# Patient Record
Sex: Male | Born: 1975 | Race: White | Hispanic: No | Marital: Single | Smoking: Never smoker
Health system: Southern US, Community
[De-identification: ages and names within clinical notes are randomized; demographics above are authoritative.]

---

## 2013-10-31 ENCOUNTER — Ambulatory Visit: Payer: Self-pay

## 2020-02-25 ENCOUNTER — Ambulatory Visit (INDEPENDENT_AMBULATORY_CARE_PROVIDER_SITE_OTHER): Payer: Self-pay

## 2020-02-25 ENCOUNTER — Encounter (HOSPITAL_COMMUNITY): Payer: Self-pay | Admitting: *Deleted

## 2020-02-25 ENCOUNTER — Other Ambulatory Visit: Payer: Self-pay

## 2020-02-25 ENCOUNTER — Ambulatory Visit (HOSPITAL_COMMUNITY)
Admission: EM | Admit: 2020-02-25 | Discharge: 2020-02-25 | Disposition: A | Discharge status: Home / Self Care | Payer: Self-pay | Attending: Internal Medicine | Admitting: Internal Medicine

## 2020-02-25 DIAGNOSIS — W2201XA Walked into wall, initial encounter: Secondary | ICD-10-CM

## 2020-02-25 DIAGNOSIS — M25541 Pain in joints of right hand: Secondary | ICD-10-CM

## 2020-02-25 DIAGNOSIS — M79641 Pain in right hand: Secondary | ICD-10-CM

## 2020-02-25 MED ORDER — IBUPROFEN 600 MG PO TABS: 600.0000 mg | ORAL_TABLET | Freq: Four times a day (QID) | ORAL | 0 refills | Status: DC | PRN

## 2020-02-25 NOTE — Discharge Instructions (Signed)
Take ibuprofen as needed for discomfort.  Rest and elevate your hand.  Apply ice packs as directed.    Schedule an appointment with the hand specialist listed below if your symptoms are not improving.

## 2020-02-25 NOTE — ED Triage Notes (Signed)
Pt reports he punched a wall with RT hand and thinks the hand is broken.

## 2020-02-25 NOTE — ED Provider Notes (Signed)
[facility]    CSN: [id number] Arrival date & time: 02/25/20  0843      History   Chief Complaint Chief Complaint  Patient presents with  . Hand Injury    RT    HPI Steve Hicks is a 45 y.o. male.   Patient presents with pain and swelling of his right hand after punching a wall last night.  He denies numbness.  No open wounds.  He denies fever, chills, chest pain, shortness of breath, abdominal pain, or other symptoms.  He states he previously broke his right hand 20 years ago.  The history is provided by the patient and medical records.    History reviewed. No pertinent past medical history.  There are no problems to display for this patient.   History reviewed. No pertinent surgical history.     Home Medications    Prior to Admission medications   Not on File    Family History History reviewed. No pertinent family history.  Social History Social History   Tobacco Use  . Smoking status: Never Smoker  . Smokeless tobacco: Never Used     Allergies   Patient has no known allergies.   Review of Systems Review of Systems  Constitutional: Negative for chills and fever.  HENT: Negative for ear pain and sore throat.   Eyes: Negative for pain and visual disturbance.  Respiratory: Negative for cough and shortness of breath.   Cardiovascular: Negative for chest pain and palpitations.  Gastrointestinal: Negative for abdominal pain and vomiting.  Genitourinary: Negative for dysuria and hematuria.  Musculoskeletal: Positive for arthralgias. Negative for back pain.  Skin: Negative for color change and rash.  Neurological: Negative for seizures, syncope, weakness and numbness.  All other systems reviewed and are negative.    Physical Exam Triage Vital Signs ED Triage Vitals  Enc Vitals Group     BP 02/25/20 0920 122/82     Pulse Rate 02/25/20 0920 90     Resp 02/25/20 0920 16     Temp 02/25/20 0920 98.3 F (36.8 C)     Temp Source  02/25/20 0920 Oral     SpO2 02/25/20 0920 99 %     Weight --      Height --      Head Circumference --      Peak Flow --      Pain Score 02/25/20 0924 5     Pain Loc --      Pain Edu? --      Excl. in GC? --    No data found.  Updated Vital Signs BP 122/82 (BP Location: Left Arm)   Pulse 90   Temp 98.3 F (36.8 C) (Oral)   Resp 16   SpO2 99%   Visual Acuity Right Eye Distance:   Left Eye Distance:   Bilateral Distance:    Right Eye Near:   Left Eye Near:    Bilateral Near:     Physical Exam Vitals and nursing note reviewed.  Constitutional:      General: He is not in acute distress.    Appearance: He is well-developed and well-nourished.  HENT:     Head: Normocephalic and atraumatic.     Mouth/Throat:     Mouth: Mucous membranes are moist.  Eyes:     Conjunctiva/sclera: Conjunctivae normal.  Cardiovascular:     Rate and Rhythm: Normal rate and regular rhythm.     Heart sounds: Normal heart sounds.  Pulmonary:  Effort: Pulmonary effort is normal. No respiratory distress.     Breath sounds: Normal breath sounds.  Abdominal:     Palpations: Abdomen is soft.     Tenderness: There is no abdominal tenderness.  Musculoskeletal:        General: Swelling, tenderness and deformity present. No edema.       Arms:     Cervical back: Neck supple.     Comments: Right hand: limited ROM due to pain. Sensation intact. No open wounds. No erythema or ecchymosis.    Skin:    General: Skin is warm and dry.     Capillary Refill: Capillary refill takes less than 2 seconds.     Findings: No bruising, erythema, lesion or rash.  Neurological:     General: No focal deficit present.     Mental Status: He is alert and oriented to person, place, and time.     Sensory: No sensory deficit.     Motor: No weakness.     Gait: Gait normal.  Psychiatric:        Mood and Affect: Mood and affect and mood normal.        Behavior: Behavior normal.      UC Treatments / Results   Labs (all labs ordered are listed, but only abnormal results are displayed) Labs Reviewed - No data to display  EKG   Radiology DG Hand Complete Right  Result Date: 02/25/2020 CLINICAL DATA:  Pain after punching wall, history of fracture EXAM: RIGHT HAND - COMPLETE 3+ VIEW COMPARISON:  None. FINDINGS: No dislocation. No acute fracture identified. Probable chronic fracture at the base of the fifth metacarpal. There is no evidence of arthropathy or other focal bone abnormality. IMPRESSION: No acute fracture or dislocation. Electronically Signed   By: Guadlupe Spanish M.D.   On: 02/25/2020 10:04    Procedures Procedures (including critical care time)  Medications Ordered in UC Medications - No data to display  Initial Impression / Assessment and Plan / UC Course  I have reviewed the triage vital signs and the nursing notes.  Pertinent labs & imaging results that were available during my care of the patient were reviewed by me and considered in my medical decision making (see chart for details).   Right hand pain.  X-ray shows no acute fracture.  Patient declines prescription ibuprofen.  Discussed symptomatic treatment with rest, elevation, ice packs, OTC ibuprofen.  Instructed patient to follow-up with hand specialist Dr. Izora Ribas (oncall) if his symptoms are not improving.  He agrees to plan of care.   Final Clinical Impressions(s) / UC Diagnoses   Final diagnoses:  Right hand pain     Discharge Instructions     Take ibuprofen as needed for discomfort.  Rest and elevate your hand.  Apply ice packs as directed.    Schedule an appointment with the hand specialist listed below if your symptoms are not improving.        ED Prescriptions    Medication Sig Dispense Auth. Provider   ibuprofen (ADVIL) 600 MG tablet  (Status: Discontinued) Take 1 tablet (600 mg total) by mouth every 6 (six) hours as needed. 30 tablet Mickie Bail, NP     PDMP not reviewed this encounter.   Mickie Bail, NP 02/25/20 1022

## 2022-07-20 IMAGING — DX DG HAND COMPLETE 3+V*R*
3 series · 3 of 3 positions shown · non-contrast
Comparison: None.

CLINICAL DATA: Pain after punching wall, history of fracture

EXAM:
RIGHT HAND - COMPLETE 3+ VIEW

[hand pa]
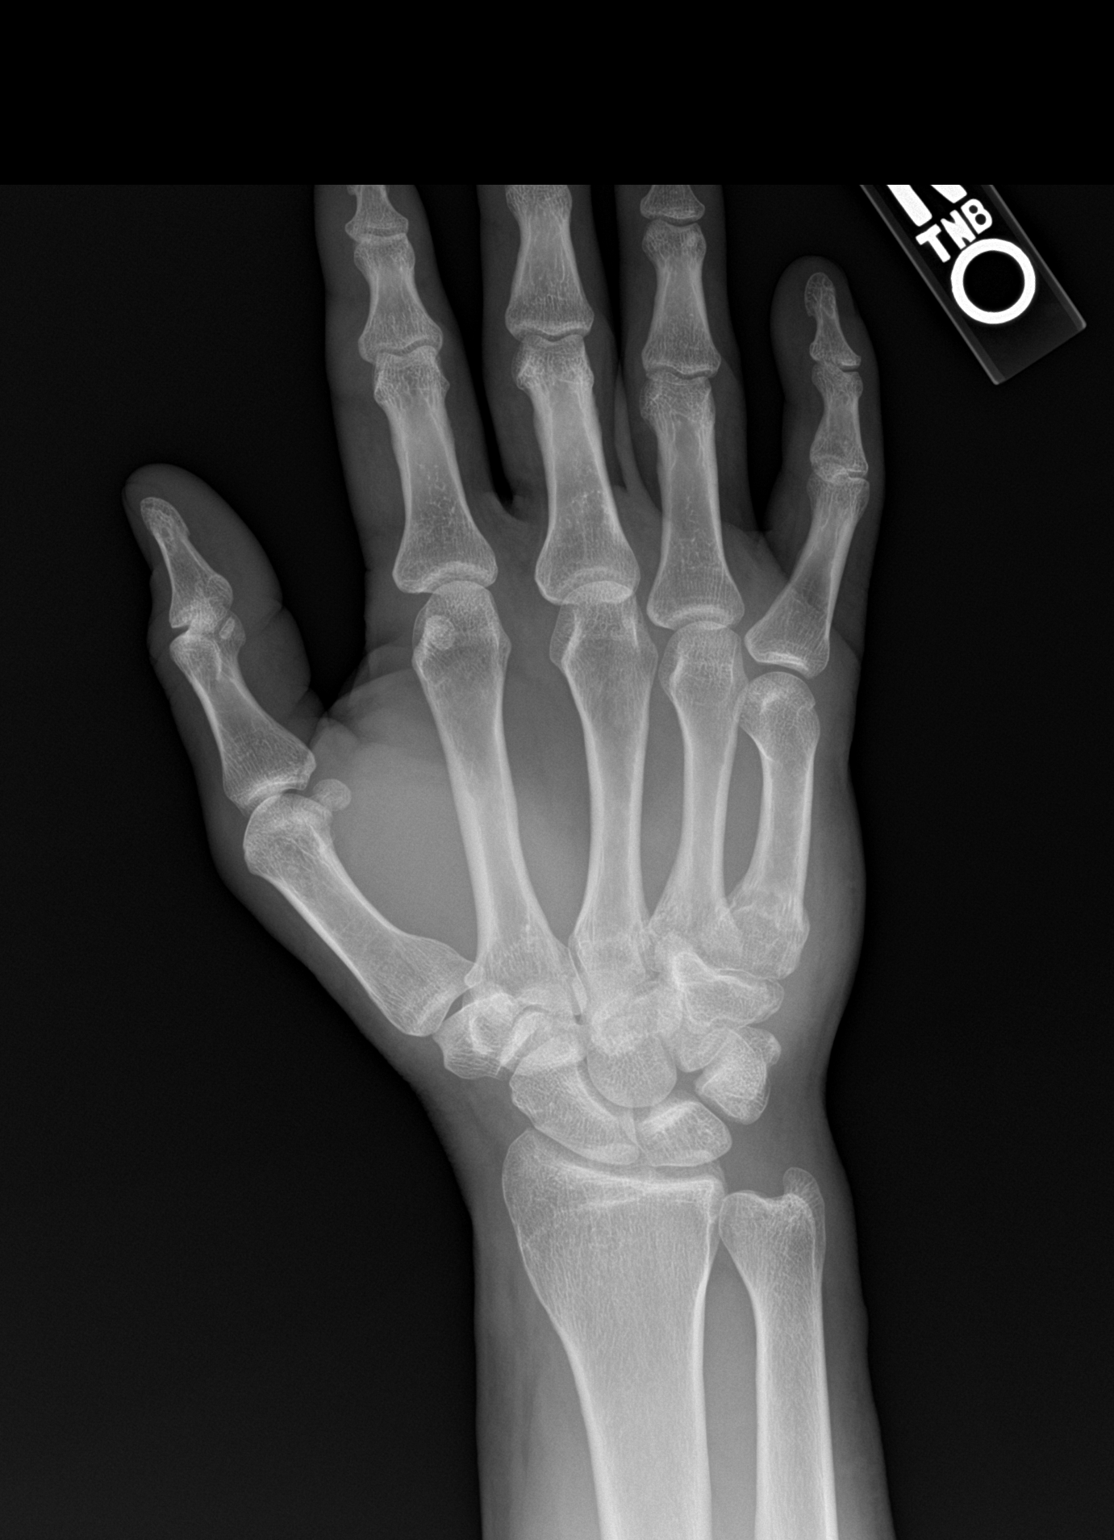

[hand obl]
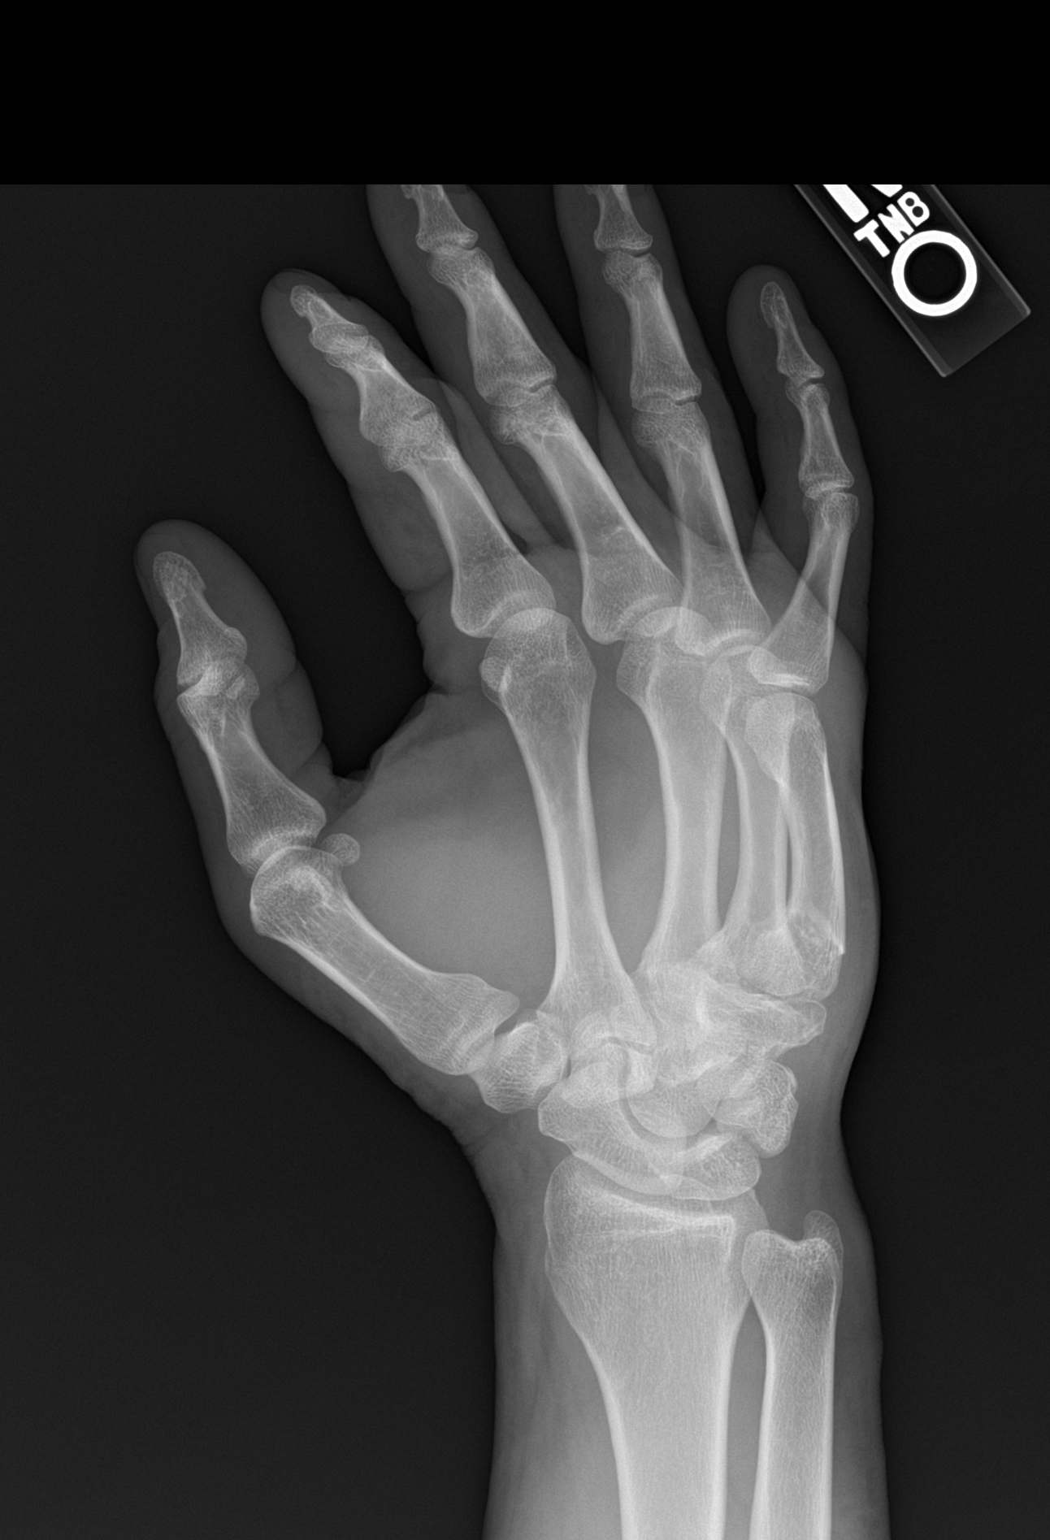

[hand lat]
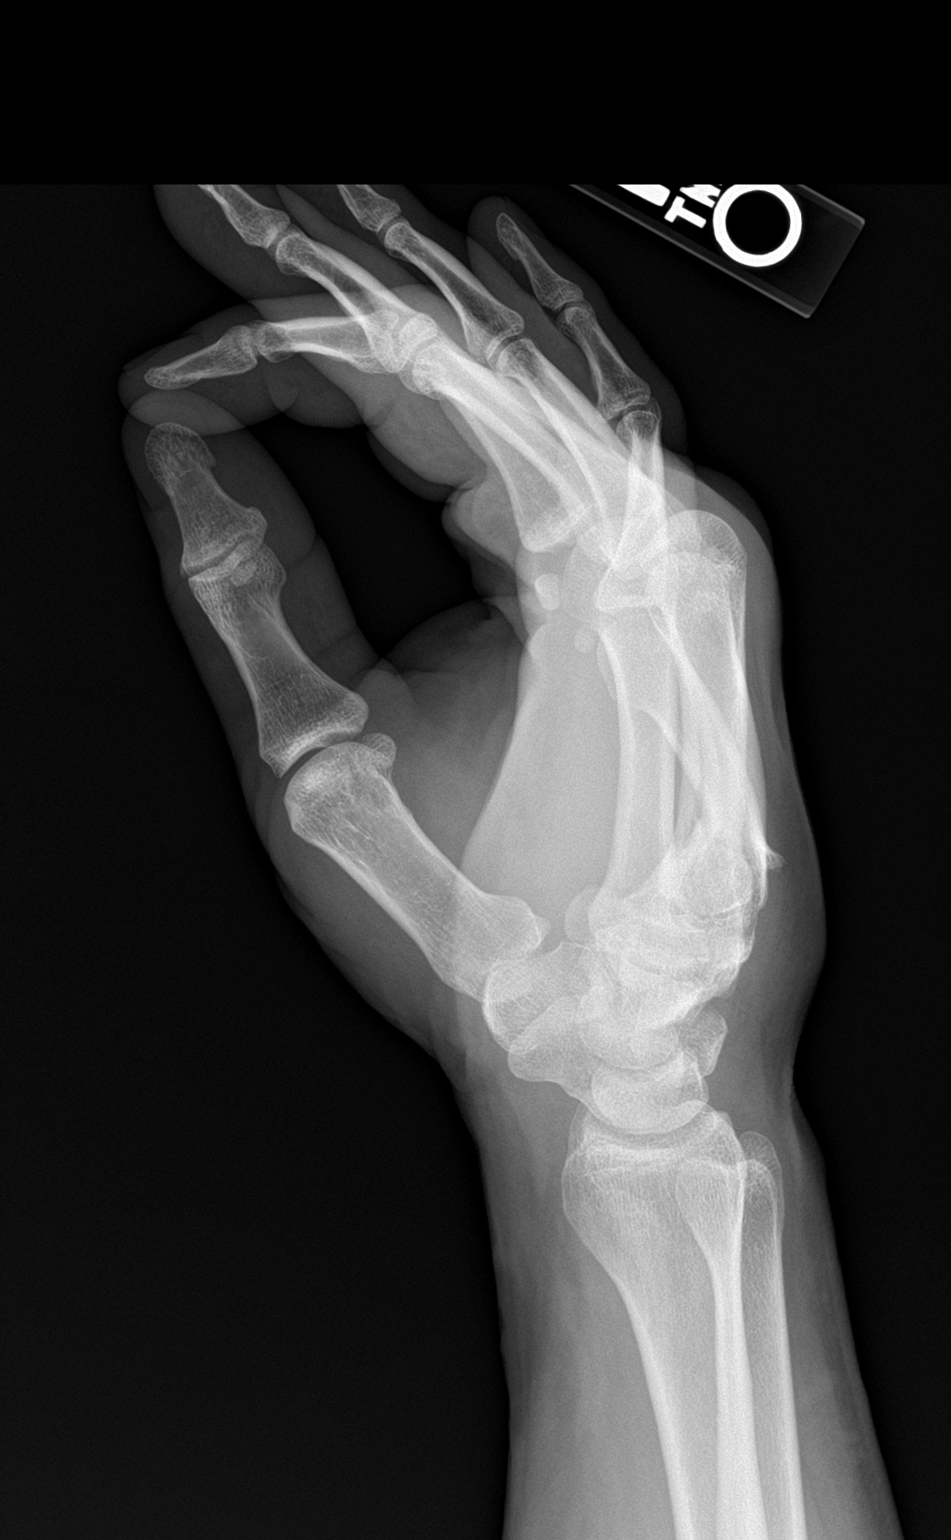

[3 of 3 positions shown; findings below may reference images not displayed]

FINDINGS: No dislocation. No acute fracture identified. Probable chronic
fracture at the base of the fifth metacarpal. There is no evidence
of arthropathy or other focal bone abnormality.
IMPRESSION: No acute fracture or dislocation.

## 2023-05-29 ENCOUNTER — Ambulatory Visit
Admission: EM | Admit: 2023-05-29 | Discharge: 2023-05-29 | Disposition: A | Discharge status: Home / Self Care | Payer: Self-pay | Attending: Family Medicine | Admitting: Family Medicine

## 2023-05-29 DIAGNOSIS — S61211A Laceration without foreign body of left index finger without damage to nail, initial encounter: Secondary | ICD-10-CM

## 2023-05-29 DIAGNOSIS — Z23 Encounter for immunization: Secondary | ICD-10-CM

## 2023-05-29 MED ORDER — TETANUS-DIPHTH-ACELL PERTUSSIS 5-2.5-18.5 LF-MCG/0.5 IM SUSY
0.5000 mL | PREFILLED_SYRINGE | Freq: Once | INTRAMUSCULAR | Status: AC
  Administered 2023-05-29: 0.5 mL via INTRAMUSCULAR

## 2023-05-29 NOTE — ED Triage Notes (Signed)
 Patient presents to UC for laceration to left index finger today at 0930. States he cut it with equipment. Last Tdap unknown.

## 2023-05-29 NOTE — ED Provider Notes (Signed)
 [facility]  Note:  This document was prepared using Conservation officer, historic buildings and may include unintentional dictation errors.  MRN: [id number] DOB: 12-Jul-1975  Subjective:   Steve Hicks is a 48 y.o. male presenting for laceration to left index finger over the proximal phalanx that happened this morning while using a router at home.  Patient reports that router cut into his finger causing immediate laceration and polarizing overlying skin flap.  Patient here in urgent care for evaluation for possible need for laceration repair.  Patient does not know when his last tetanus booster was.  Patient immediately cleaned wound at home prior to coming to urgent care.  Patient was unsure that anything could be done due to the destruction of the overlying skin flap.  Patient reports immediate bleeding and pain but states there is more painful to irrigate wound at home than it was when injury occurred.  Bleeding moderately well-controlled at arrival to urgent care.  No other medical concerns at this time.  No current facility-administered medications for this encounter. No current outpatient medications on file.   Allergies  Allergen Reactions   Other Other (See Comments)    Shellfish- shock  PEANUTS    History reviewed. No pertinent past medical history.   History reviewed. No pertinent surgical history.  History reviewed. No pertinent family history.  Social History   Tobacco Use   Smoking status: Never   Smokeless tobacco: Never    ROS Refer to HPI for ROS details.  Objective:   Vitals: BP (!) 135/100 (BP Location: Left Arm)   Pulse 81   Temp 98 F (36.7 C) (Oral)   Resp 16   SpO2 97%   Physical Exam Vitals and nursing note reviewed.  Constitutional:      General: He is not in acute distress.    Appearance: He is well-developed. He is not ill-appearing or toxic-appearing.  HENT:     Head: Normocephalic.  Cardiovascular:     Rate and Rhythm:  Normal rate.  Pulmonary:     Effort: Pulmonary effort is normal. No respiratory distress.  Skin:    General: Skin is warm and dry.     Capillary Refill: Capillary refill takes less than 2 seconds.     Findings: Laceration and wound (Left index finger laceration approximately 1 cm wide and 2 cm in length, wound gaping with pulverized skin flap in between wound edges, no chance for laceration repair with sutures due to pulverize skin flap, bleeding well-controlled.) present. No bruising or erythema.  Neurological:     General: No focal deficit present.     Mental Status: He is alert and oriented to person, place, and time.  Psychiatric:        Mood and Affect: Mood normal.        Behavior: Behavior normal.     Procedures  No results found for this or any previous visit (from the past 24 hours).  Assessment and Plan :     Discharge Instructions      1. Laceration of left index finger without foreign body without damage to nail, initial encounter (Primary) - Tdap (BOOSTRIX) injection 0.5 mL provided in UC - Wound care (Clean Wound) completed in UC prior to wound evaluation and attempted closure. - Apply dressing after procedure completed with Steri-Strips and Coban. - Leave bandage in place for 24 hours after which you may remove and if wound is not draining or showing signs of secondary infection you may leave open  to air. - Continue to monitor for 48 to 72 hours for signs of secondary infection if you experience any increase in redness, drainage, increased pain, warmth to the surrounding tissues, or fever follow-up for further evaluation and antibiotic therapy        [name]   [name], NP 05/29/23 1214

## 2023-05-29 NOTE — Discharge Instructions (Addendum)
 1. Laceration of left index finger without foreign body without damage to nail, initial encounter (Primary) - Tdap (BOOSTRIX) injection 0.5 mL provided in UC - Wound care (Clean Wound) completed in UC prior to wound evaluation and attempted closure. - Apply dressing after procedure completed with Steri-Strips and Coban. - Leave bandage in place for 24 hours after which you may remove and if wound is not draining or showing signs of secondary infection you may leave open to air. - Continue to monitor for 48 to 72 hours for signs of secondary infection if you experience any increase in redness, drainage, increased pain, warmth to the surrounding tissues, or fever follow-up for further evaluation and antibiotic therapy
